# Patient Record
Sex: Female | Born: 1975 | Race: White | Hispanic: No | Smoking: Current every day smoker
Health system: Southern US, Community
[De-identification: ages and names within clinical notes are randomized; demographics above are authoritative.]

---

## 2002-03-20 ENCOUNTER — Inpatient Hospital Stay (HOSPITAL_COMMUNITY): Admission: AD | Admit: 2002-03-20 | Discharge: 2002-03-20 | Payer: Self-pay | Admitting: Obstetrics and Gynecology

## 2002-10-28 ENCOUNTER — Inpatient Hospital Stay (HOSPITAL_COMMUNITY): Admission: AD | Admit: 2002-10-28 | Discharge: 2002-10-28 | Payer: Self-pay | Admitting: Family Medicine

## 2002-11-14 ENCOUNTER — Encounter: Admission: RE | Admit: 2002-11-14 | Discharge: 2002-11-14 | Payer: Self-pay | Admitting: Family Medicine

## 2003-05-13 ENCOUNTER — Other Ambulatory Visit: Admission: RE | Admit: 2003-05-13 | Discharge: 2003-05-13 | Payer: Self-pay | Admitting: Family Medicine

## 2003-08-29 ENCOUNTER — Emergency Department (HOSPITAL_COMMUNITY): Admission: EM | Admit: 2003-08-29 | Discharge: 2003-08-29 | Payer: Self-pay | Admitting: Emergency Medicine

## 2003-09-17 ENCOUNTER — Emergency Department (HOSPITAL_COMMUNITY): Admission: EM | Admit: 2003-09-17 | Discharge: 2003-09-17 | Payer: Self-pay | Admitting: Emergency Medicine

## 2003-09-25 ENCOUNTER — Ambulatory Visit (HOSPITAL_COMMUNITY): Admission: RE | Admit: 2003-09-25 | Discharge: 2003-09-26 | Payer: Self-pay | Admitting: General Surgery

## 2012-01-19 DIAGNOSIS — I639 Cerebral infarction, unspecified: Secondary | ICD-10-CM | POA: Insufficient documentation

## 2012-06-24 DIAGNOSIS — J069 Acute upper respiratory infection, unspecified: Secondary | ICD-10-CM | POA: Insufficient documentation

## 2012-06-24 DIAGNOSIS — J45909 Unspecified asthma, uncomplicated: Secondary | ICD-10-CM | POA: Insufficient documentation

## 2012-11-01 DIAGNOSIS — G40209 Localization-related (focal) (partial) symptomatic epilepsy and epileptic syndromes with complex partial seizures, not intractable, without status epilepticus: Secondary | ICD-10-CM | POA: Insufficient documentation

## 2012-11-01 DIAGNOSIS — I6330 Cerebral infarction due to thrombosis of unspecified cerebral artery: Secondary | ICD-10-CM | POA: Insufficient documentation

## 2016-10-27 DIAGNOSIS — M7062 Trochanteric bursitis, left hip: Secondary | ICD-10-CM | POA: Insufficient documentation

## 2016-10-27 DIAGNOSIS — K219 Gastro-esophageal reflux disease without esophagitis: Secondary | ICD-10-CM | POA: Insufficient documentation

## 2016-10-27 DIAGNOSIS — R600 Localized edema: Secondary | ICD-10-CM | POA: Insufficient documentation

## 2016-10-27 DIAGNOSIS — R233 Spontaneous ecchymoses: Secondary | ICD-10-CM | POA: Insufficient documentation

## 2016-10-27 DIAGNOSIS — E559 Vitamin D deficiency, unspecified: Secondary | ICD-10-CM | POA: Insufficient documentation

## 2016-10-27 DIAGNOSIS — G4730 Sleep apnea, unspecified: Secondary | ICD-10-CM | POA: Insufficient documentation

## 2016-10-27 DIAGNOSIS — E7800 Pure hypercholesterolemia, unspecified: Secondary | ICD-10-CM | POA: Insufficient documentation

## 2016-11-19 DIAGNOSIS — R5381 Other malaise: Secondary | ICD-10-CM | POA: Insufficient documentation

## 2016-11-23 DIAGNOSIS — K1230 Oral mucositis (ulcerative), unspecified: Secondary | ICD-10-CM | POA: Insufficient documentation

## 2018-05-15 DIAGNOSIS — M65972 Unspecified synovitis and tenosynovitis, left ankle and foot: Secondary | ICD-10-CM | POA: Insufficient documentation

## 2018-05-15 DIAGNOSIS — M659 Synovitis and tenosynovitis, unspecified: Secondary | ICD-10-CM | POA: Insufficient documentation

## 2018-09-15 ENCOUNTER — Ambulatory Visit
Admission: EM | Admit: 2018-09-15 | Discharge: 2018-09-15 | Disposition: A | Discharge status: Home / Self Care | Payer: BLUE CROSS/BLUE SHIELD | Attending: Emergency Medicine | Admitting: Emergency Medicine

## 2018-09-15 ENCOUNTER — Ambulatory Visit (INDEPENDENT_AMBULATORY_CARE_PROVIDER_SITE_OTHER): Payer: BLUE CROSS/BLUE SHIELD

## 2018-09-15 DIAGNOSIS — S93491A Sprain of other ligament of right ankle, initial encounter: Secondary | ICD-10-CM | POA: Diagnosis not present

## 2018-09-15 NOTE — ED Triage Notes (Signed)
Pt c/o rt ankle/foot pain with swelling. States walking the dog and twisted rt ankle and it went numb

## 2018-09-15 NOTE — ED Provider Notes (Signed)
[facility]    CSN: [id number] Arrival date & time: 09/15/18  1924      History   Chief Complaint Chief Complaint  Patient presents with  . Ankle Pain    HPI Mia Rose is a 43 y.o. female presenting for right foot and ankle pain, swelling.  States she was walking her dog less than an hour ago, twisted her ankle, "not sure if it fell in a hole worsening Flip-flops ".  States that she looked down and noticed significant swelling to the lateral aspect of her foot dorsum.  Patient endorsing brief episode of numbness, resolved PTA.  Patient unable to bear weight, applied ice with moderate pain relief.     History reviewed. No pertinent past medical history.  There are no active problems to display for this patient.   History reviewed. No pertinent surgical history.  OB History   No obstetric history on file.      Home Medications    Prior to Admission medications   Not on File    Family History No family history on file.  Social History Social History   Tobacco Use  . Smoking status: Never Smoker  . Smokeless tobacco: Never Used  Substance Use Topics  . Alcohol use: Not Currently  . Drug use: Not on file     Allergies   Penicillins   Review of Systems Review of Systems  Constitutional: Negative for fatigue and fever.  Respiratory: Negative for cough and shortness of breath.   Cardiovascular: Negative for chest pain and palpitations.  Musculoskeletal:       Positive for right ankle and foot pain, swelling, weakness, inability to bear weight Negative for numbness  Neurological: Negative for weakness and numbness.     Physical Exam Triage Vital Signs ED Triage Vitals [09/15/18 1928]  Enc Vitals Group     BP 127/86     Pulse Rate 80     Resp 18     Temp 98.5 F (36.9 C)     Temp Source Oral     SpO2 96 %     Weight      Height      Head Circumference      Peak Flow      Pain Score 7     Pain Loc      Pain Edu?    Excl. in [place]?    No data found.  Updated Vital Signs BP 127/86 (BP Location: Left Arm)   Pulse 80   Temp 98.5 F (36.9 C) (Oral)   Resp 18   SpO2 96%   Visual Acuity Right Eye Distance:   Left Eye Distance:   Bilateral Distance:    Right Eye Near:   Left Eye Near:    Bilateral Near:     Physical Exam Constitutional:      General: She is not in acute distress. HENT:     Head: Normocephalic and atraumatic.  Eyes:     General: No scleral icterus.    Conjunctiva/sclera: Conjunctivae normal.     Pupils: Pupils are equal, round, and reactive to light.  Cardiovascular:     Rate and Rhythm: Normal rate.  Pulmonary:     Effort: Pulmonary effort is normal. No respiratory distress.  Musculoskeletal:     Comments: Right lateral aspect of proximal foot dorsum with significant edema that's tender to palpation.  No lateral or medial malleoli tenderness.  DP pulses intact.  Limited ROM second to pain/swelling.  Neurovascularly  intact.  Unable to ambulate  Neurological:     General: No focal deficit present.     Mental Status: She is alert.      UC Treatments / Results  Labs (all labs ordered are listed, but only abnormal results are displayed) Labs Reviewed - No data to display  EKG   Radiology Dg Ankle Complete Right  Result Date: 09/15/2018 CLINICAL DATA:  Fall EXAM: RIGHT ANKLE - COMPLETE 3+ VIEW COMPARISON:  None. FINDINGS: Plantar calcaneal spur. No acute bony abnormality. Specifically, no fracture, subluxation, or dislocation. Joint spaces maintained. IMPRESSION: No acute bony abnormality. Electronically Signed   By: Charlett NoseKevin  Dover M.D.   On: 09/15/2018 19:54   Dg Foot Complete Right  Result Date: 09/15/2018 CLINICAL DATA:  Fall, foot pain EXAM: RIGHT FOOT COMPLETE - 3+ VIEW COMPARISON:  None. FINDINGS: Plantar calcaneal spur. No acute bony abnormality. Specifically, no fracture, subluxation, or dislocation. IMPRESSION: No acute bony abnormality. Electronically Signed    By: Charlett NoseKevin  Dover M.D.   On: 09/15/2018 19:54    Procedures Procedures (including critical care time)  Medications Ordered in UC Medications - No data to display  Initial Impression / Assessment and Plan / UC Course  I have reviewed the triage vital signs and the nursing notes.  Pertinent labs & imaging results that were available during my care of the patient were reviewed by me and considered in my medical decision making (see chart for details).     1.  Right ankle sprain Right ankle, foot x-rays done in office, reviewed by me radiology: Both were negative for acute fracture, dislocation.  Patient's foot wrapped with Ace wrap, given crutches which she tolerated well.  Will treat pain as listed below, have patient follow-up with Ortho in 1 week should range of motion, pain not improve.  Return precautions discussed, patient verbalized understanding and is agreeable to plan. Final Clinical Impressions(s) / UC Diagnoses   Final diagnoses:  Sprain of anterior talofibular ligament of right ankle, initial encounter     Discharge Instructions     May ice, rest, elevate the area(s) of pain.   May use OTC Tylenol, ibuprofen as needed for pain. Return if you develop worsening pain, chest pain, difficulty breathing.    ED Prescriptions    None     Controlled Substance Prescriptions [place] Controlled Substance Registry consulted? Not Applicable   Shea EvansHall-Potvin, [place]any, [place]-C 09/15/18 2023

## 2018-09-15 NOTE — Discharge Instructions (Signed)
May ice, rest, elevate the area(s) of pain.   °May use OTC Tylenol, ibuprofen as needed for pain. °Return if you develop worsening pain, chest pain, difficulty breathing. °

## 2019-10-25 DIAGNOSIS — N926 Irregular menstruation, unspecified: Secondary | ICD-10-CM | POA: Insufficient documentation

## 2019-10-25 DIAGNOSIS — F5101 Primary insomnia: Secondary | ICD-10-CM | POA: Insufficient documentation

## 2020-10-20 IMAGING — DX RIGHT ANKLE - COMPLETE 3+ VIEW
3 series · 3 of 3 positions shown · non-contrast
Comparison: None.

CLINICAL DATA: Fall

EXAM:
RIGHT ANKLE - COMPLETE 3+ VIEW

[ankle ap]
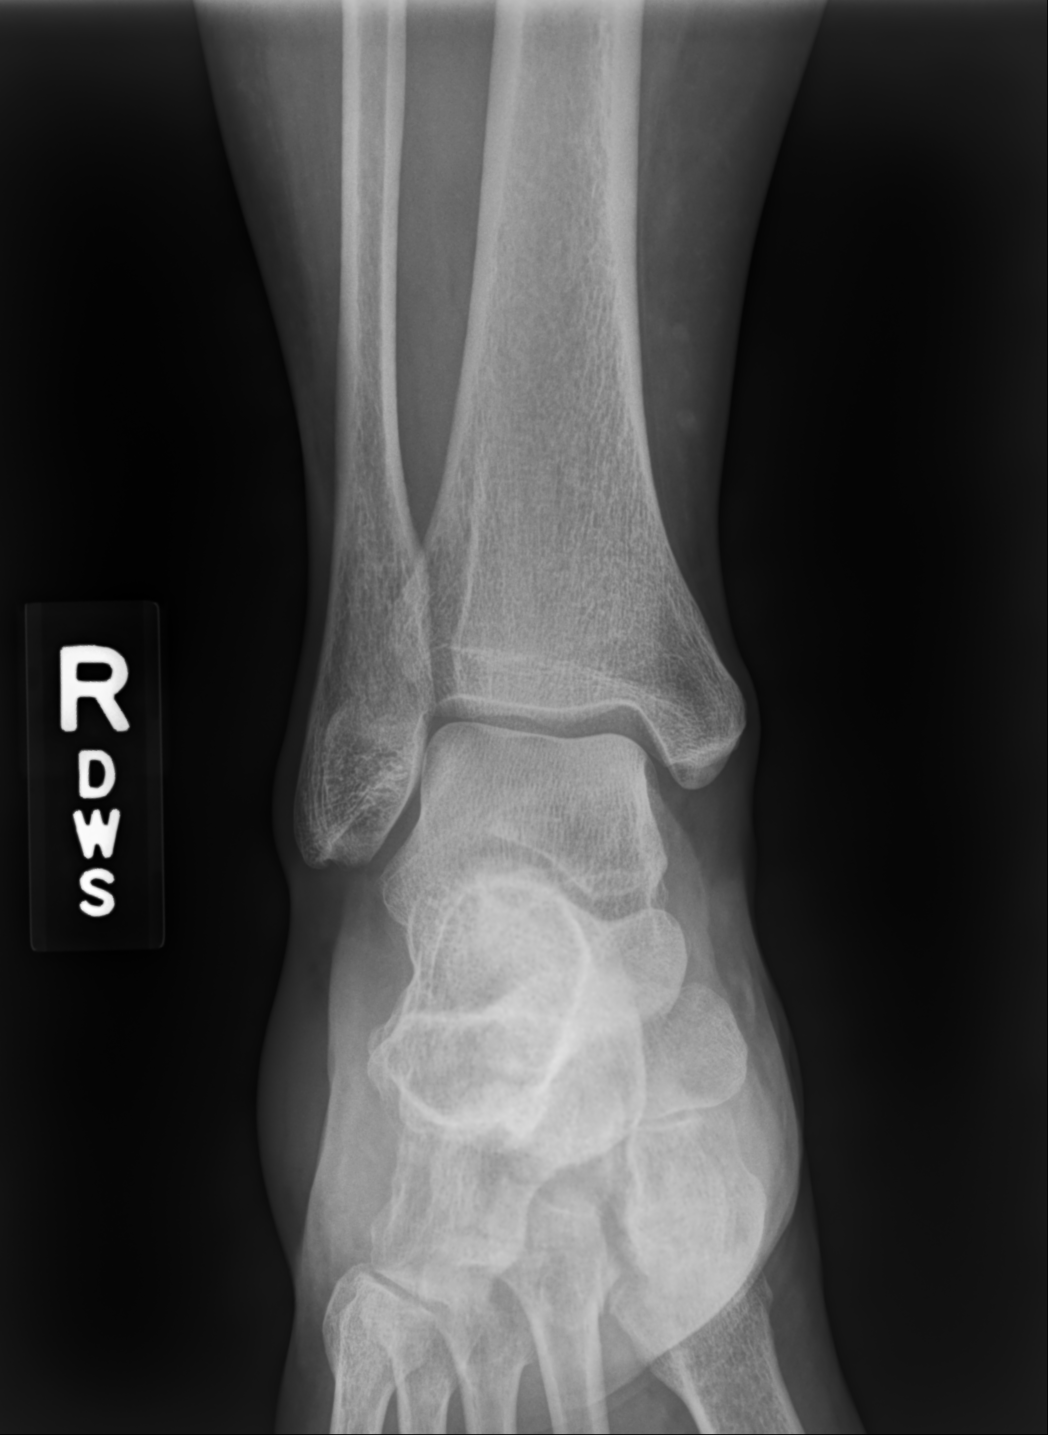

[ankle medial oblique]
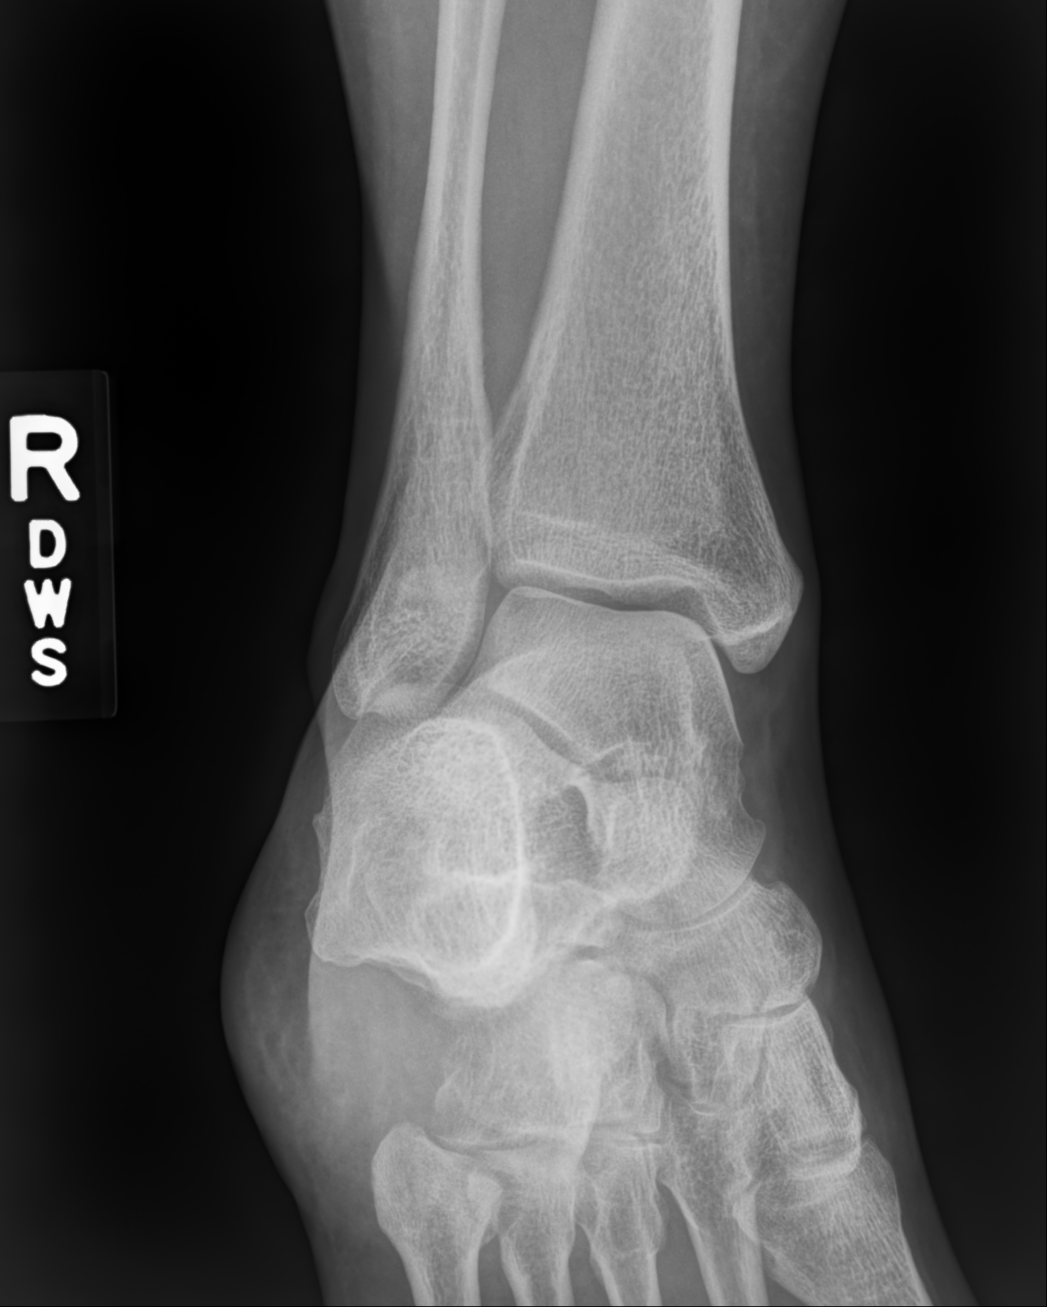

[ankle lat]
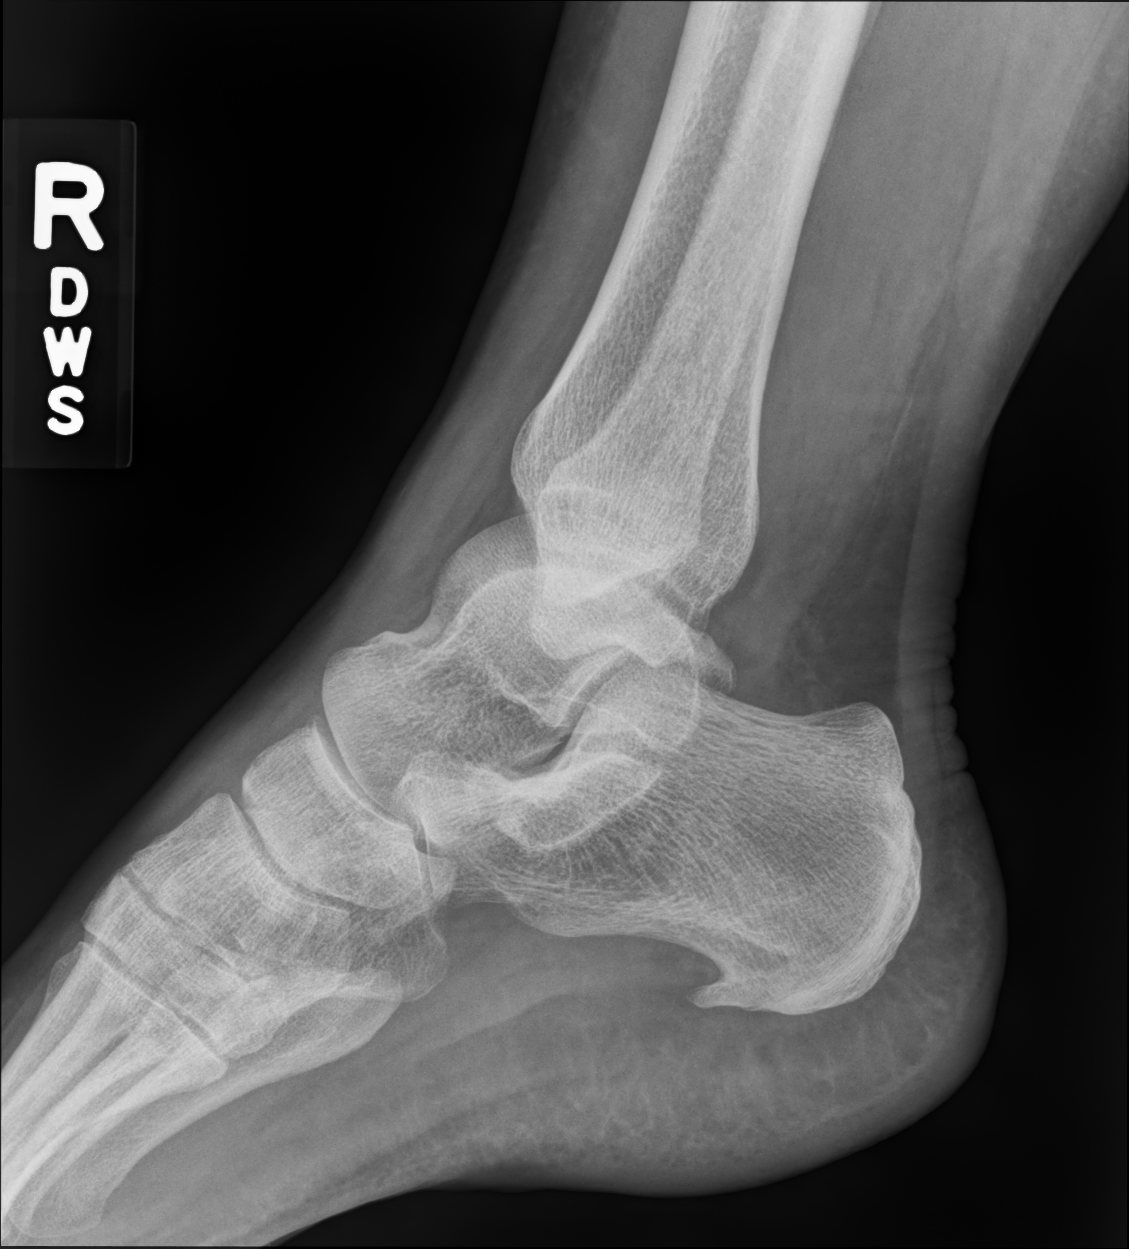

[3 of 3 positions shown; findings below may reference images not displayed]

FINDINGS: Plantar calcaneal spur. No acute bony abnormality. Specifically, no
fracture, subluxation, or dislocation. Joint spaces maintained.
IMPRESSION: No acute bony abnormality.

## 2022-01-29 DIAGNOSIS — B977 Papillomavirus as the cause of diseases classified elsewhere: Secondary | ICD-10-CM | POA: Insufficient documentation

## 2022-02-12 ENCOUNTER — Ambulatory Visit (INDEPENDENT_AMBULATORY_CARE_PROVIDER_SITE_OTHER): Payer: Commercial Managed Care - HMO | Admitting: Adult Health

## 2022-02-12 ENCOUNTER — Encounter: Payer: Self-pay | Admitting: Adult Health

## 2022-02-12 VITALS — BP 128/84 | HR 81 | Ht 65.0 in | Wt 173.0 lb

## 2022-02-12 DIAGNOSIS — F411 Generalized anxiety disorder: Secondary | ICD-10-CM

## 2022-02-12 DIAGNOSIS — F4321 Adjustment disorder with depressed mood: Secondary | ICD-10-CM | POA: Diagnosis not present

## 2022-02-12 DIAGNOSIS — N920 Excessive and frequent menstruation with regular cycle: Secondary | ICD-10-CM | POA: Insufficient documentation

## 2022-02-12 DIAGNOSIS — G4700 Insomnia, unspecified: Secondary | ICD-10-CM | POA: Diagnosis not present

## 2022-02-12 DIAGNOSIS — F331 Major depressive disorder, recurrent, moderate: Secondary | ICD-10-CM

## 2022-02-12 MED ORDER — HYDROXYZINE HCL 25 MG PO TABS: 25.0000 mg | ORAL_TABLET | Freq: Three times a day (TID) | ORAL | 0 refills | Status: DC | PRN

## 2022-02-12 MED ORDER — SERTRALINE HCL 50 MG PO TABS: ORAL_TABLET | ORAL | 2 refills | Status: DC

## 2022-02-12 NOTE — Progress Notes (Signed)
[facility] MD/PA/NP Initial Note  02/12/2022 12:17 PM Mia Rose  MRN:  [id number]  Chief Complaint:   HPI:   Patient seen today for initial psychiatric evaluation.   Referred by a friend.  Accompanied by a friend.  Planning to meet with a new PCP on February 2.  History of seizures. 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  Describes mood today as "not good". Pleasant. Tearful throughout interview. Mood symptoms - reports depression, anxiety, and irritability. Stating "some days are better than others". Reports worry, rumination, and over thinking. Reports obsessive thoughts and acts. Stating "I'm a neat freak - things have places they should be in". Reports racing thoughts - "I have a lot in mind". Reports mood fluctuations. Stating "I haven't been doing good". Lost her husband and father in 05/07/2019 and has struggled to move forward. Has not seen a counselor or worked with grief counseling. Does not feel like she is handling things well on her own. Has started using cocaine to help slow her mind down - "it helps me". Stating "it makes everything stop". Would like to stop using and find a medication that will help her. Stating "it's been difficult trying to make it own my own". Stable interest and motivation. Taking medications as prescribed.  Energy levels stable - more energy when getting off of work. Active, does not have a regular exercise routine.  Enjoys some usual interests and activities. Widowed. Lives alone. Has 2 grown children. Spending time with family. Appetite adequate - "off and on". Weight loss - 295 to 173 over the past 2 years Reports sleeping difficulties. Hasn't slept well since father and husband passed away May 07, 2019. Father with colon cancer Focus and concentration stable. Completing tasks. Managing aspects of household. 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 Denies SI or HI.  Denies AH or VH. Denies alcohol use. Reports substance use - using cocaine use  twice a week - less than a year. Counseling when younger.  Journaling - has helped. Has a notebook for husband and father.  Previous medication trials: Denies  Visit Diagnosis:    ICD-10-CM   1. Major depressive disorder, recurrent episode, moderate (HCC)  F33.1 sertraline (ZOLOFT) 50 MG tablet    2. Generalized anxiety disorder  F41.1 sertraline (ZOLOFT) 50 MG tablet    3. Insomnia, unspecified type  G47.00 hydrOXYzine (ATARAX) 25 MG tablet    4. Unresolved grief  F43.21 sertraline (ZOLOFT) 50 MG tablet      Past Psychiatric History: Denies psychiatric hospitalization.   Past Medical History: No past medical history on file. No past surgical history on file.  Family Psychiatric History: Family history of mental illness.   Family History: No family history on file.  Social History:  Social History   Socioeconomic History   Marital status: Married    Spouse name: Not on file   Number of children: Not on file   Years of education: Not on file   Highest education level: Not on file  Occupational History   Not on file  Tobacco Use   Smoking status: Never   Smokeless tobacco: Never  Substance and Sexual Activity   Alcohol use: Not Currently   Drug use: Not on file   Sexual activity: Not on file  Other Topics Concern   Not on file  Social History Narrative   Not on file   Social Determinants of Health   Financial Resource Strain: Not on file  Food Insecurity: Not on file  Transportation  Needs: Not on file  Physical Activity: Not on file  Stress: Not on file  Social Connections: Not on file    Allergies:  Allergies  Allergen Reactions   Codeine Hives   Cumin Oil Swelling    throat   Latex Hives   Other Swelling    throat   Oxycodone-Acetaminophen Hives and Itching   Penicillins Hives and Other (See Comments)    As a child   Tramadol Swelling   Hydrocodone Rash    Metabolic Disorder Labs: No results found for: "HGBA1C", "MPG" No results found for:  "PROLACTIN" No results found for: "CHOL", "TRIG", "HDL", "CHOLHDL", "VLDL", "LDLCALC" No results found for: "TSH"  Therapeutic Level Labs: No results found for: "LITHIUM" No results found for: "VALPROATE" No results found for: "CBMZ"  Current Medications: Current Outpatient Medications  Medication Sig Dispense Refill   hydrOXYzine (ATARAX) 25 MG tablet Take 1 tablet (25 mg total) by mouth 3 (three) times daily as needed. 30 tablet 0   sertraline (ZOLOFT) 50 MG tablet Take 1/2 tablet daily for 7 days, then increase to one tablet daily. 30 tablet 2   No current facility-administered medications for this visit.    Medication Side Effects: none  Orders placed this visit:  No orders of the defined types were placed in this encounter.   Psychiatric Specialty Exam:  Review of Systems  Musculoskeletal:  Negative for gait problem.  Neurological:  Negative for tremors.  Psychiatric/Behavioral:         Please refer to HPI    There were no vitals taken for this visit.There is no height or weight on file to calculate BMI.  General Appearance: Casual and Neat  Eye Contact:  Good  Speech:  Clear and Coherent and Normal Rate  Volume:  Normal  Mood:  Anxious and Depressed  Affect:  Appropriate, Congruent, and Tearful  Thought Process:  Coherent and Descriptions of Associations: Intact  Orientation:  Full (Time, Place, and Person)  Thought Content: Logical   Suicidal Thoughts:  No  Homicidal Thoughts:  No  Memory:  WNL  Judgement:  Good  Insight:  Good  Psychomotor Activity:  Normal  Concentration:  Concentration: Good and Attention Span: Good  Recall:  Good  Fund of Knowledge: Good  Language: Good  Assets:  Communication Skills Desire for Improvement Financial Resources/Insurance Housing Intimacy Leisure Time Physical Health Resilience Social Support Talents/Skills Transportation Vocational/Educational  ADL's:  Intact  Cognition: WNL  Prognosis:  Good   Screenings:  MDQ  Receiving Psychotherapy: No   Treatment Plan/Recommendations:  Plan:  PDMP reviewed  Add Zoloft 50mg  daily - take 1/2 tablet daily for 7 days, then increase to one tablet daily. Add Hydroxyzine 25mg  - 1 to 2 at hs  RTC 4 weeks  Patient advised to contact office with any questions, adverse effects, or acute worsening in signs and symptoms.    Aloha Gell, NP

## 2022-03-12 ENCOUNTER — Ambulatory Visit (INDEPENDENT_AMBULATORY_CARE_PROVIDER_SITE_OTHER): Payer: Self-pay | Admitting: Adult Health

## 2022-03-12 DIAGNOSIS — F489 Nonpsychotic mental disorder, unspecified: Secondary | ICD-10-CM

## 2022-03-12 NOTE — Progress Notes (Signed)
Patient no show appointment. ? ?

## 2022-06-04 ENCOUNTER — Ambulatory Visit (INDEPENDENT_AMBULATORY_CARE_PROVIDER_SITE_OTHER): Payer: Commercial Managed Care - HMO | Admitting: Adult Health

## 2022-06-04 ENCOUNTER — Encounter: Payer: Self-pay | Admitting: Adult Health

## 2022-06-04 DIAGNOSIS — F4321 Adjustment disorder with depressed mood: Secondary | ICD-10-CM | POA: Diagnosis not present

## 2022-06-04 DIAGNOSIS — F331 Major depressive disorder, recurrent, moderate: Secondary | ICD-10-CM

## 2022-06-04 DIAGNOSIS — G4700 Insomnia, unspecified: Secondary | ICD-10-CM

## 2022-06-04 DIAGNOSIS — F411 Generalized anxiety disorder: Secondary | ICD-10-CM | POA: Diagnosis not present

## 2022-06-04 MED ORDER — SERTRALINE HCL 100 MG PO TABS
ORAL_TABLET | ORAL | 2 refills | Status: DC
Start: 2022-06-04 — End: 2022-07-09

## 2022-06-04 MED ORDER — QUETIAPINE FUMARATE 50 MG PO TABS
50.0000 mg | ORAL_TABLET | Freq: Every day | ORAL | 2 refills | Status: DC
Start: 2022-06-04 — End: 2022-07-09

## 2022-06-04 MED ORDER — HYDROXYZINE HCL 25 MG PO TABS: 25.0000 mg | ORAL_TABLET | Freq: Four times a day (QID) | ORAL | 1 refills | Status: DC | PRN

## 2022-06-04 NOTE — Progress Notes (Signed)
Mia Rose [id number] 02/11/1975 47 y.o.  Subjective:   Patient ID:  Mia Rose is a 47 y.o. (DOB 06-14-1975) female.  Chief Complaint: No chief complaint on file.   HPI Mia Rose presents to the office today for follow-up of MDD, GAD, grief, and insomnia.  Planning to meet with a new PCP   History of seizures. Stroke age 10 - issues with short term memory.  Describes mood today as "not good". Pleasant. Tearful throughout interview. Mood symptoms - reports depression, anxiety, and irritability. Reports worry, rumination, and over thinking. Reports obsessive thoughts and acts. Reports racing thoughts - "difficulties cutting my mind off". Reports mood fluctuations - "having ups and downs". Stating "I feel like I'm still struggling". Willing to consider other options. Decreased interest and motivation. Taking medications as prescribed.   Lost her husband and father in 07-02-2019 and has struggled to move forward. Has not seen a counselor or worked with grief counseling.   Energy levels lower. Active, does not have a regular exercise routine.  Enjoys some usual interests and activities. Widowed. Lives alone. Has 2 grown children. Spending time with family. Appetite adequate - not eating much. Reports weight loss. Reports sleeping difficulties. Averages 2 to 3 hours a night. Hasn't slept well since father and husband passed away 07-02-2019.  Focus and concentration difficulties. Completing tasks. Managing aspects of household. 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 Denies SI or HI.  Denies AH or VH. Denies alcohol use. Reports substance use - using cocaine once a week - less than a year.  Previous medication trials: Denies  Review of Systems:  Review of Systems  Musculoskeletal:  Negative for gait problem.  Neurological:  Negative for tremors.  Psychiatric/Behavioral:         Please refer to HPI    Medications: I  have reviewed the patient's current medications.  Current Outpatient Medications  Medication Sig Dispense Refill   hydrOXYzine (ATARAX) 25 MG tablet Take 1 tablet (25 mg total) by mouth 3 (three) times daily as needed. 30 tablet 0   sertraline (ZOLOFT) 50 MG tablet Take 1/2 tablet daily for 7 days, then increase to one tablet daily. 30 tablet 2   No current facility-administered medications for this visit.    Medication Side Effects: None  Allergies:  Allergies  Allergen Reactions   Codeine Hives   Cumin Oil Swelling    throat   Latex Hives   Other Swelling    throat   Oxycodone-Acetaminophen Hives and Itching   Penicillins Hives and Other (See Comments)    As a child   Tramadol Swelling   Hydrocodone Rash    No past medical history on file.  Past Medical History, Surgical history, Social history, and Family history were reviewed and updated as appropriate.   Please see review of systems for further details on the patient's review from today.   Objective:   Physical Exam:  There were no vitals taken for this visit.  Physical Exam Constitutional:      General: She is not in acute distress. Musculoskeletal:        General: No deformity.  Neurological:     Mental Status: She is alert and oriented to person, place, and time.     Coordination: Coordination normal.  Psychiatric:        Attention and Perception: Attention and perception normal. She does not perceive auditory or visual hallucinations.  Mood and Affect: Mood normal. Mood is not anxious or depressed. Affect is not labile, blunt, angry or inappropriate.        Speech: Speech normal.        Behavior: Behavior normal.        Thought Content: Thought content normal. Thought content is not paranoid or delusional. Thought content does not include homicidal or suicidal ideation. Thought content does not include homicidal or suicidal plan.        Cognition and Memory: Cognition and memory normal.         Judgment: Judgment normal.     Comments: Insight intact     Lab Review:  No results found for: "NA", "K", "CL", "CO2", "GLUCOSE", "BUN", "CREATININE", "CALCIUM", "PROT", "ALBUMIN", "AST", "ALT", "ALKPHOS", "BILITOT", "GFRNONAA", "GFRAA"  No results found for: "WBC", "RBC", "HGB", "HCT", "PLT", "MCV", "MCH", "MCHC", "RDW", "LYMPHSABS", "MONOABS", "EOSABS", "BASOSABS"  No results found for: "POCLITH", "LITHIUM"   No results found for: "PHENYTOIN", "PHENOBARB", "VALPROATE", "CBMZ"   .res Assessment: Plan:    Plan:  PDMP reviewed  Increase Zoloft 50mg  to 100mg  daily  Increase Hydroxyzine 25mg  - 1 to 2 at hs to one tablet four times a day. Add Seroquel 50mg  at hs  RTC 4 weeks  Patient advised to contact office with any questions, adverse effects, or acute worsening in signs and symptoms.  Discussed potential metabolic side effects associated with atypical antipsychotics, as well as potential risk for movement side effects. Advised pt to contact office if movement side effects occur.     There are no diagnoses linked to this encounter.   Please see After Visit Summary for patient specific instructions.  No future appointments.  No orders of the defined types were placed in this encounter.   -------------------------------

## 2022-07-02 ENCOUNTER — Ambulatory Visit: Payer: Commercial Managed Care - HMO | Admitting: Adult Health

## 2022-07-09 ENCOUNTER — Ambulatory Visit (INDEPENDENT_AMBULATORY_CARE_PROVIDER_SITE_OTHER): Payer: Commercial Managed Care - HMO | Admitting: Adult Health

## 2022-07-09 ENCOUNTER — Encounter: Payer: Self-pay | Admitting: Adult Health

## 2022-07-09 DIAGNOSIS — F4321 Adjustment disorder with depressed mood: Secondary | ICD-10-CM | POA: Diagnosis not present

## 2022-07-09 DIAGNOSIS — F331 Major depressive disorder, recurrent, moderate: Secondary | ICD-10-CM

## 2022-07-09 DIAGNOSIS — F411 Generalized anxiety disorder: Secondary | ICD-10-CM | POA: Diagnosis not present

## 2022-07-09 DIAGNOSIS — G4700 Insomnia, unspecified: Secondary | ICD-10-CM

## 2022-07-09 MED ORDER — SERTRALINE HCL 100 MG PO TABS
ORAL_TABLET | ORAL | 2 refills | Status: AC
Start: 2022-07-09 — End: ?

## 2022-07-09 MED ORDER — BUSPIRONE HCL 10 MG PO TABS
ORAL_TABLET | ORAL | 2 refills | Status: AC
Start: 2022-07-09 — End: ?

## 2022-07-09 MED ORDER — QUETIAPINE FUMARATE 100 MG PO TABS
100.0000 mg | ORAL_TABLET | Freq: Every day | ORAL | 2 refills | Status: AC
Start: 2022-07-09 — End: ?

## 2022-07-09 NOTE — Progress Notes (Signed)
Mia Rose [id number] 11/08/1975 47 y.o.  Subjective:   Patient ID:  Mia Rose is a 47 y.o. (DOB May 27, 1975) female.  Chief Complaint: No chief complaint on file.   HPI Mia Rose presents to the office today for follow-up of MDD, GAD, grief, and insomnia.  Planning to meet with a new PCP   History of seizures. Stroke age 19 - issues with short term memory.  Describes mood today as "not good". Pleasant. Tearful. Mood symptoms - reports depression. Denies anxiety and irritability. Reports decreased worry, rumination, and over thinking - "not as many tabs open". Reports decreased obsessive thoughts and acts. Reports racing thoughts - "a little better". Reports mood fluctuations. Stating "things are about the same". Willing to consider other options. Decreased interest and motivation. Taking medications as prescribed.   Lost her husband and father in 2019-07-29 and has struggled to move forward. Has not seen a counselor or worked with grief counseling.   Energy levels lower. Active, does not have a regular exercise routine.  Enjoys some usual interests and activities. Widowed. Lives alone. Has 2 grown children. Spending time with family. Appetite adequate - not eating much. Reports weight loss. Reports sleeping difficulties. Averages 2 to 3 hours a night. Hasn't slept well since father and husband passed away 07-29-2019.  Focus and concentration difficulties. Completing tasks. Managing aspects of household. 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 Denies SI or HI.  Denies AH or VH. Reports self harm. Denies alcohol use. Reports substance use - using cocaine occasionally.  Previous medication trials: Denies       Review of Systems:  Review of Systems  Musculoskeletal:  Negative for gait problem.  Neurological:  Negative for tremors.  Psychiatric/Behavioral:         Please refer to HPI    Medications: I have  reviewed the patient's current medications.  Current Outpatient Medications  Medication Sig Dispense Refill   hydrOXYzine (ATARAX) 25 MG tablet Take 1 tablet (25 mg total) by mouth every 6 (six) hours as needed. 120 tablet 1   QUEtiapine (SEROQUEL) 50 MG tablet Take 1 tablet (50 mg total) by mouth at bedtime. 30 tablet 2   sertraline (ZOLOFT) 100 MG tablet Take one tablet daily. 30 tablet 2   No current facility-administered medications for this visit.    Medication Side Effects: None  Allergies:  Allergies  Allergen Reactions   Codeine Hives   Cumin Oil Swelling    throat   Latex Hives   Other Swelling    throat   Oxycodone-Acetaminophen Hives and Itching   Penicillins Hives and Other (See Comments)    As a child   Tramadol Swelling   Hydrocodone Rash    No past medical history on file.  Past Medical History, Surgical history, Social history, and Family history were reviewed and updated as appropriate.   Please see review of systems for further details on the patient's review from today.   Objective:   Physical Exam:  There were no vitals taken for this visit.  Physical Exam Constitutional:      General: She is not in acute distress. Musculoskeletal:        General: No deformity.  Neurological:     Mental Status: She is alert and oriented to person, place, and time.     Cranial Nerves: No dysarthria.     Coordination: Coordination normal.  Psychiatric:  Attention and Perception: Attention and perception normal. She does not perceive auditory or visual hallucinations.        Mood and Affect: Mood normal. Mood is not anxious or depressed. Affect is not labile, blunt, angry or inappropriate.        Speech: Speech normal.        Behavior: Behavior normal. Behavior is cooperative.        Thought Content: Thought content normal. Thought content is not paranoid or delusional. Thought content does not include homicidal or suicidal ideation. Thought content does  not include homicidal or suicidal plan.        Cognition and Memory: Cognition and memory normal.        Judgment: Judgment normal.     Comments: Insight intact     Lab Review:  No results found for: "NA", "K", "CL", "CO2", "GLUCOSE", "BUN", "CREATININE", "CALCIUM", "PROT", "ALBUMIN", "AST", "ALT", "ALKPHOS", "BILITOT", "GFRNONAA", "GFRAA"  No results found for: "WBC", "RBC", "HGB", "HCT", "PLT", "MCV", "MCH", "MCHC", "RDW", "LYMPHSABS", "MONOABS", "EOSABS", "BASOSABS"  No results found for: "POCLITH", "LITHIUM"   No results found for: "PHENYTOIN", "PHENOBARB", "VALPROATE", "CBMZ"   .res Assessment: Plan:    Plan:  PDMP reviewed  Zoloft 100mg  daily   D/C Hydroxyzine 25mg  - 1 to 2 at hs to one tablet four times a day - ineffective. Add Buspar 10mg  TID  Increase Seroquel 50mg  to 100mg  at hs  RTC 4 weeks  Patient advised to contact office with any questions, adverse effects, or acute worsening in signs and symptoms.  Discussed potential metabolic side effects associated with atypical antipsychotics, as well as potential risk for movement side effects. Advised pt to contact office if movement side effects occur.    There are no diagnoses linked to this encounter.   Please see After Visit Summary for patient specific instructions.  Future Appointments  Date Time Provider Department Center  07/09/2022 11:20 AM Tineka Uriegas, Thereasa Solo, NP [facility] None    No orders of the defined types were placed in this encounter.   -------------------------------

## 2022-08-06 ENCOUNTER — Ambulatory Visit: Payer: Commercial Managed Care - HMO | Admitting: Adult Health

## 2022-08-06 NOTE — Progress Notes (Signed)
R/S per patient
